# Patient Record
Sex: Male | Born: 1985 | Race: Asian | Hispanic: No | Marital: Single | Smoking: Former smoker
Health system: Southern US, Community
[De-identification: ages and names within clinical notes are randomized; demographics above are authoritative.]

---

## 2005-02-16 ENCOUNTER — Emergency Department (HOSPITAL_COMMUNITY): Admission: EM | Admit: 2005-02-16 | Discharge: 2005-02-16 | Payer: Self-pay | Admitting: Emergency Medicine

## 2008-11-23 ENCOUNTER — Emergency Department (HOSPITAL_COMMUNITY): Admission: EM | Admit: 2008-11-23 | Discharge: 2008-11-23 | Payer: Self-pay | Admitting: Emergency Medicine

## 2009-01-16 ENCOUNTER — Emergency Department (HOSPITAL_COMMUNITY): Admission: EM | Admit: 2009-01-16 | Discharge: 2009-01-16 | Payer: Self-pay | Admitting: Emergency Medicine

## 2009-02-23 ENCOUNTER — Emergency Department (HOSPITAL_COMMUNITY): Admission: EM | Admit: 2009-02-23 | Discharge: 2009-02-23 | Payer: Self-pay | Admitting: Emergency Medicine

## 2010-10-31 LAB — CBC
HCT: 48.4 % (ref 39.0–52.0)
Hemoglobin: 16.4 g/dL (ref 13.0–17.0)
MCV: 73.3 fL — ABNORMAL LOW (ref 78.0–100.0)
RBC: 6.6 MIL/uL — ABNORMAL HIGH (ref 4.22–5.81)
WBC: 7.5 10*3/uL (ref 4.0–10.5)

## 2010-10-31 LAB — COMPREHENSIVE METABOLIC PANEL
Alkaline Phosphatase: 91 U/L (ref 39–117)
BUN: 12 mg/dL (ref 6–23)
CO2: 27 mEq/L (ref 19–32)
Chloride: 102 mEq/L (ref 96–112)
Creatinine, Ser: 1.07 mg/dL (ref 0.4–1.5)
GFR calc non Af Amer: >60 mL/min (ref >60)
Glucose, Bld: 98 mg/dL (ref 70–99)
Potassium: 3.8 mEq/L (ref 3.5–5.1)
Total Bilirubin: 0.9 mg/dL (ref 0.3–1.2)

## 2010-10-31 LAB — DIFFERENTIAL
Basophils Absolute: 0 10*3/uL (ref 0.0–0.1)
Basophils Relative: 0 % (ref 0–1)
Lymphocytes Relative: 17 % (ref 12–46)
Neutro Abs: 4.8 10*3/uL (ref 1.7–7.7)

## 2010-10-31 LAB — LIPASE, BLOOD: Lipase: 19 U/L (ref 11–59)

## 2018-01-20 ENCOUNTER — Emergency Department (HOSPITAL_COMMUNITY): Payer: Self-pay

## 2018-01-20 ENCOUNTER — Encounter (HOSPITAL_COMMUNITY): Payer: Self-pay | Admitting: Nurse Practitioner

## 2018-01-20 ENCOUNTER — Emergency Department (HOSPITAL_COMMUNITY)
Admission: EM | Admit: 2018-01-20 | Discharge: 2018-01-20 | Disposition: A | Discharge status: Home / Self Care | Payer: Self-pay | Attending: Emergency Medicine | Admitting: Emergency Medicine

## 2018-01-20 DIAGNOSIS — K529 Noninfective gastroenteritis and colitis, unspecified: Secondary | ICD-10-CM

## 2018-01-20 DIAGNOSIS — R110 Nausea: Secondary | ICD-10-CM

## 2018-01-20 DIAGNOSIS — R1084 Generalized abdominal pain: Secondary | ICD-10-CM

## 2018-01-20 DIAGNOSIS — Z87891 Personal history of nicotine dependence: Secondary | ICD-10-CM | POA: Insufficient documentation

## 2018-01-20 DIAGNOSIS — R197 Diarrhea, unspecified: Secondary | ICD-10-CM

## 2018-01-20 LAB — LIPASE, BLOOD: Lipase: 26 U/L (ref 11–51)

## 2018-01-20 LAB — CBC
HEMATOCRIT: 48.2 % (ref 39.0–52.0)
Hemoglobin: 16.2 g/dL (ref 13.0–17.0)
MCH: 24.4 pg — ABNORMAL LOW (ref 26.0–34.0)
MCHC: 33.6 g/dL (ref 30.0–36.0)
MCV: 72.5 fL — AB (ref 78.0–100.0)
PLATELETS: 282 10*3/uL (ref 150–400)
RBC: 6.65 MIL/uL — ABNORMAL HIGH (ref 4.22–5.81)
RDW: 14.4 % (ref 11.5–15.5)
WBC: 7.4 10*3/uL (ref 4.0–10.5)

## 2018-01-20 LAB — COMPREHENSIVE METABOLIC PANEL
ALK PHOS: 46 U/L (ref 38–126)
ALT: 43 U/L (ref 0–44)
AST: 33 U/L (ref 15–41)
Albumin: 4.2 g/dL (ref 3.5–5.0)
Anion gap: 7 (ref 5–15)
BILIRUBIN TOTAL: 2.1 mg/dL — AB (ref 0.3–1.2)
BUN: 17 mg/dL (ref 6–20)
CO2: 30 mmol/L (ref 22–32)
Calcium: 9.4 mg/dL (ref 8.9–10.3)
Chloride: 102 mmol/L (ref 98–111)
Creatinine, Ser: 1.04 mg/dL (ref 0.61–1.24)
GFR calc Af Amer: >60 mL/min (ref >60)
GLUCOSE: 91 mg/dL (ref 70–99)
POTASSIUM: 4.4 mmol/L (ref 3.5–5.1)
Sodium: 139 mmol/L (ref 135–145)
TOTAL PROTEIN: 7.6 g/dL (ref 6.5–8.1)

## 2018-01-20 LAB — URINALYSIS, ROUTINE W REFLEX MICROSCOPIC
BILIRUBIN URINE: NEGATIVE
GLUCOSE, UA: NEGATIVE mg/dL
Hgb urine dipstick: NEGATIVE
KETONES UR: 5 mg/dL — AB
LEUKOCYTES UA: NEGATIVE
Nitrite: NEGATIVE
PH: 5 (ref 5.0–8.0)
Protein, ur: NEGATIVE mg/dL
Specific Gravity, Urine: 1.025 (ref 1.005–1.030)

## 2018-01-20 MED ORDER — DICYCLOMINE HCL 20 MG PO TABS: 20.0000 mg | ORAL_TABLET | Freq: Three times a day (TID) | ORAL | 0 refills | Status: DC | PRN

## 2018-01-20 MED ORDER — FAMOTIDINE IN NACL 20-0.9 MG/50ML-% IV SOLN
20.0000 mg | Freq: Once | INTRAVENOUS | Status: AC
  Administered 2018-01-20: 20 mg via INTRAVENOUS
  Filled 2018-01-20: qty 50

## 2018-01-20 MED ORDER — IOPAMIDOL (ISOVUE-300) INJECTION 61%
100.0000 mL | Freq: Once | INTRAVENOUS | Status: AC | PRN
  Administered 2018-01-20: 100 mL via INTRAVENOUS

## 2018-01-20 MED ORDER — GI COCKTAIL ~~LOC~~
30.0000 mL | Freq: Once | ORAL | Status: AC
  Administered 2018-01-20: 30 mL via ORAL
  Filled 2018-01-20: qty 30

## 2018-01-20 MED ORDER — IOPAMIDOL (ISOVUE-300) INJECTION 61%
INTRAVENOUS | Status: AC
  Administered 2018-01-20: 20:00:00
  Filled 2018-01-20: qty 100

## 2018-01-20 MED ORDER — SODIUM CHLORIDE 0.9 % IV BOLUS
1000.0000 mL | Freq: Once | INTRAVENOUS | Status: AC
  Administered 2018-01-20: 1000 mL via INTRAVENOUS

## 2018-01-20 MED ORDER — DICYCLOMINE HCL 10 MG PO CAPS
20.0000 mg | ORAL_CAPSULE | Freq: Once | ORAL | Status: AC
  Administered 2018-01-20: 20 mg via ORAL
  Filled 2018-01-20: qty 2

## 2018-01-20 MED ORDER — ONDANSETRON 4 MG PO TBDP: 4.0000 mg | ORAL_TABLET | Freq: Three times a day (TID) | ORAL | 0 refills | Status: DC | PRN

## 2018-01-20 MED ORDER — ONDANSETRON HCL 4 MG/2ML IJ SOLN
4.0000 mg | Freq: Once | INTRAMUSCULAR | Status: AC
  Administered 2018-01-20: 4 mg via INTRAVENOUS
  Filled 2018-01-20: qty 2

## 2018-01-20 NOTE — ED Notes (Signed)
Pt aware urine sample is needed 

## 2018-01-20 NOTE — ED Provider Notes (Signed)
[facility]-EMERGENCY DEPT Provider Note   CSN: [id number] Arrival date & time: 01/20/18  1354     History   Chief Complaint Chief Complaint  Patient presents with  . Abdominal Pain    HPI Christian Mcintosh is a 32 y.o. male with no known/reported PMHx, who presents to the ED with complaints of generalized abdominal pain x 1 week as well as bloating, early satiety, nausea, and diarrhea.  He states that last Saturday he had "stomachache" and after that his symptoms started.  He describes his pain as 6/10 intermittent generalized burning abdominal pain that is nonradiating, worse with eating, and somewhat improved with Pepto-Bismol and Colace.  He reports 3 episodes daily of nonbloody loose/liquidy diarrhea.  He states that anytime he eats he feels very full very quickly, and feels bloated.  He admits to drinking alcohol regularly, has about 4 drinks every 3 days, had alcohol on Wednesday and Friday (4 and 2 days ago respectively).  He denies having a PCP or GI specialist that he sees.  He denies having any medical problems.  He states that just prior to onset of symptoms he had cereal with milk, and he is not used to eating that however denies any other suspicious food intake.  He denies fevers, chills, CP, SOB, vomiting, constipation, obstipation, melena, hematochezia, hematuria, dysuria, myalgias, arthralgias, numbness, tingling, focal weakness, or any other complaints at this time. Denies recent travel, sick contacts, suspicious food intake, NSAID use, recent abx, or prior abd surgeries.   The history is provided by the patient and medical records. No language interpreter was used.    History reviewed. No pertinent past medical history.  There are no active problems to display for this patient.   History reviewed. No pertinent surgical history.      Home Medications    Prior to Admission medications   Not on File    Family History No family history on  file.  Social History Social History   Tobacco Use  . Smoking status: Former Smoker  Substance Use Topics  . Alcohol use: Yes  . Drug use: Not Currently    Types: Marijuana     Allergies   Patient has no known allergies.   Review of Systems Review of Systems  Constitutional: Negative for chills and fever.  Respiratory: Negative for shortness of breath.   Cardiovascular: Negative for chest pain.  Gastrointestinal: Positive for abdominal distention (bloating/early satiety), abdominal pain, diarrhea and nausea. Negative for blood in stool, constipation and vomiting.  Genitourinary: Negative for dysuria and hematuria.  Musculoskeletal: Negative for arthralgias and myalgias.  Skin: Negative for color change.  Allergic/Immunologic: Negative for immunocompromised state.  Neurological: Negative for weakness and numbness.  Psychiatric/Behavioral: Negative for confusion.   All other systems reviewed and are negative for acute change except as noted in the HPI.    Physical Exam Updated Vital Signs BP 136/88 (BP Location: Right Arm)   Pulse 60   Temp 98 F (36.7 C) (Oral)   Resp 16   Ht 5\' 4"  (1.626 m)   Wt 81.2 kg (179 lb)   SpO2 100%   BMI 30.73 kg/m   Physical Exam  Constitutional: He is oriented to person, place, and time. Vital signs are normal. He appears well-developed and well-nourished.  Non-toxic appearance. No distress.  Afebrile, nontoxic, NAD  HENT:  Head: Normocephalic and atraumatic.  Mouth/Throat: Oropharynx is clear and moist and mucous membranes are normal.  Eyes: Conjunctivae and EOM are normal. Right  eye exhibits no discharge. Left eye exhibits no discharge.  Neck: Normal range of motion. Neck supple.  Cardiovascular: Normal rate, regular rhythm, normal heart sounds and intact distal pulses. Exam reveals no gallop and no friction rub.  No murmur heard. Pulmonary/Chest: Effort normal and breath sounds normal. No respiratory distress. He has no decreased  breath sounds. He has no wheezes. He has no rhonchi. He has no rales.  Abdominal: Soft. Normal appearance and bowel sounds are normal. He exhibits no distension. There is tenderness in the right lower quadrant, suprapubic area and left lower quadrant. There is no rigidity, no rebound, no guarding, no CVA tenderness, no tenderness at McBurney's point and negative Murphy's sign.  Soft, nondistended, +BS throughout, with mild diffuse lower abd TTP, no r/g/r, neg murphy's, neg mcburney's, no CVA TTP   Musculoskeletal: Normal range of motion.  Neurological: He is alert and oriented to person, place, and time. He has normal strength. No sensory deficit.  Skin: Skin is warm, dry and intact. No rash noted.  Psychiatric: He has a normal mood and affect.  Nursing note and vitals reviewed.    ED Treatments / Results  Labs (all labs ordered are listed, but only abnormal results are displayed) Labs Reviewed  COMPREHENSIVE METABOLIC PANEL - Abnormal; Notable for the following components:      Result Value   Total Bilirubin 2.1 (*)    All other components within normal limits  CBC - Abnormal; Notable for the following components:   RBC 6.65 (*)    MCV 72.5 (*)    MCH 24.4 (*)    All other components within normal limits  URINALYSIS, ROUTINE W REFLEX MICROSCOPIC - Abnormal; Notable for the following components:   Ketones, ur 5 (*)    All other components within normal limits  LIPASE, BLOOD    EKG None  Radiology Ct Abdomen Pelvis W Contrast  Result Date: 01/20/2018 CLINICAL DATA:  Diffuse abdominal pain with bloating, diarrhea EXAM: CT ABDOMEN AND PELVIS WITH CONTRAST TECHNIQUE: Multidetector CT imaging of the abdomen and pelvis was performed using the standard protocol following bolus administration of intravenous contrast. CONTRAST:  ISOVUE-300 IOPAMIDOL (ISOVUE-300) INJECTION 61% COMPARISON:  None. FINDINGS: Lower chest: No acute abnormality. Hepatobiliary: No focal liver abnormality is  seen. No gallstones, gallbladder wall thickening, or biliary dilatation. Pancreas: Unremarkable. No pancreatic ductal dilatation or surrounding inflammatory changes. Spleen: Normal in size without focal abnormality. Adrenals/Urinary Tract: Adrenal glands are unremarkable. Kidneys are normal, without renal calculi, focal lesion, or hydronephrosis. Bladder is unremarkable. Stomach/Bowel: Appendix within normal limits. Stomach is nonenlarged. No dilated small bowel. Minimal wall thickening involving the transverse colon. 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 Vascular/Lymphatic: No significant vascular findings are present. No enlarged abdominal or pelvic lymph nodes. Reproductive: Prostate is unremarkable. Other: Negative for free air or free fluid. Musculoskeletal: No acute or significant osseous findings. IMPRESSION: 1. Minimal wall thickening of the transverse colon, may reflect a mild colitis of infectious or inflammatory etiology 2. Descending and sigmoid colon diverticular disease without convincing evidence for inflammatory change. Electronically Signed   By: Jasmine Pang M.D.   On: 01/20/2018 20:14    Procedures Procedures (including critical care time)  Medications Ordered in ED Medications  sodium chloride 0.9 % bolus 1,000 mL (0 mLs Intravenous Stopped 01/20/18 1918)  ondansetron (ZOFRAN) injection 4 mg (4 mg Intravenous Given 01/20/18 1830)  gi cocktail (Maalox,Lidocaine,Donnatal) (30 mLs Oral Given 01/20/18 1815)  famotidine (PEPCID) IVPB 20 mg premix (0 mg  Intravenous Stopped 01/20/18 1900)  dicyclomine (BENTYL) capsule 20 mg (20 mg Oral Given 01/20/18 1815)  iopamidol (ISOVUE-300) 61 % injection 100 mL (100 mLs Intravenous Contrast Given 01/20/18 1929)  iopamidol (ISOVUE-300) 61 % injection (  Contrast Given 01/20/18 2003)     Initial Impression / Assessment and Plan / ED Course  I have reviewed the triage vital signs and the nursing notes.  Pertinent labs & imaging  results that were available during my care of the patient were reviewed by me and considered in my medical decision making (see chart for details).     32 y.o. male here with generalized abd pain x1 wk with bloating/early satiety and nausea and diarrhea. On exam, diffuse lower abd TTP, nonperitoneal. No distension noted. Work up thus far reveals: lipase WNL; CMP with elevated Tbili 2.1 but otherwise WNL; CBC WNL. Awaiting U/A. Will get CT abd/pelv to r/o diverticulitis vs colitis vs other etiology. Will give meds/fluids and reassess shortly.   9:57 PM U/A unremarkable. CT abd/pelv with minimal wall thickening of transverse colon which may reflect mild colitis of infections or inflammatory etiology; descending and sigmoid colon diverticular disease without diverticulitis. Given lack of concerning features that would make me suspect bacterial etiology and lack of leukocytosis/fever/etc, I suspect this is likely a viral colitis. Doubt need for abx. Pt feeling better and tolerating PO well. Will send home with bentyl and zofran, advised tylenol/motrin for pain, BRAT diet, other OTC remedies for symptomatic relief, and f/up with GI in 1wk for recheck symptoms and recheck LFTs. I explained the diagnosis and have given explicit precautions to return to the ER including for any other new or worsening symptoms. The patient understands and accepts the medical plan as it's been dictated and I have answered their questions. Discharge instructions concerning home care and prescriptions have been given. The patient is STABLE and is discharged to home in good condition.    Final Clinical Impressions(s) / ED Diagnoses   Final diagnoses:  Generalized abdominal pain  Nausea  Diarrhea, unspecified type  Colitis  Hyperbilirubinemia    ED Discharge Orders        Ordered    ondansetron (ZOFRAN ODT) 4 MG disintegrating tablet  Every 8 hours PRN     01/20/18 2157    dicyclomine (BENTYL) 20 MG tablet  3 times daily  with meals PRN     01/20/18 [address], [place], [place] 01/20/18 2158    Loren Racer, MD 01/22/18 1654

## 2018-01-20 NOTE — Discharge Instructions (Addendum)
Your CT scan shows that you have mild colitis, which is an infection/inflammation in the colon; this is likely from a virus and usually will pass on its own. Use zofran as prescribed, as needed for nausea. Alternate between tylenol and motrin as needed for pain. Take bentyl to help with abdominal pain and diarrhea. May consider using over the counter tums, maalox, pepto bismol, or other over the counter remedies to help with symptoms. Stay well hydrated with small sips of fluids throughout the day. Follow a BRAT (banana-rice-applesauce-toast) diet as described below for the next 24-48 hours. The 'BRAT' diet is suggested, then progress to diet as tolerated as symptoms abate. Call your regular doctor if bloody stools, persistent diarrhea, vomiting, fever or abdominal pain. Follow up with the gastroenterologist in 1 week for recheck of symptoms and ongoing management of this issue, as well as to recheck your labs. Return to ER for changing or worsening of symptoms.

## 2018-01-20 NOTE — ED Notes (Signed)
Bed: WA08 Expected date:  Expected time:  Means of arrival:  Comments: 

## 2018-01-20 NOTE — ED Triage Notes (Signed)
Pt is c/o diffuse abdominal pain that he states is associated with severe burning bloating. Adds that it all started 7 days ago when he experienced a bout of diarrhea. States he hasn't recover ever since.

## 2019-05-30 ENCOUNTER — Ambulatory Visit (HOSPITAL_COMMUNITY)
Admission: EM | Admit: 2019-05-30 | Discharge: 2019-05-30 | Disposition: A | Discharge status: Home / Self Care | Payer: Self-pay | Attending: Family Medicine | Admitting: Family Medicine

## 2019-05-30 ENCOUNTER — Other Ambulatory Visit: Payer: Self-pay

## 2019-05-30 ENCOUNTER — Encounter (HOSPITAL_COMMUNITY): Payer: Self-pay

## 2019-05-30 DIAGNOSIS — S39011A Strain of muscle, fascia and tendon of abdomen, initial encounter: Secondary | ICD-10-CM

## 2019-05-30 MED ORDER — DICLOFENAC SODIUM 75 MG PO TBEC: 75.0000 mg | DELAYED_RELEASE_TABLET | Freq: Two times a day (BID) | ORAL | 0 refills | Status: AC

## 2019-05-30 NOTE — Discharge Instructions (Addendum)
If not better in a week, return for further evaluation

## 2019-05-30 NOTE — ED Provider Notes (Signed)
[facility]    CSN: [id number] Arrival date & time: 05/30/19  0940      History   Chief Complaint Chief Complaint  Patient presents with  . Appointment  . Groin Pain    HPI Christian Mcintosh is a 33 y.o. male.   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  He does work out as well.  He thinks there may have been swelling in each of his testes during week, but it resolves when home and only aches on left side some of the time.  No known trauma  No fever, discharge or persistent swelling.       History reviewed. No pertinent past medical history.  There are no active problems to display for this patient.   History reviewed. No pertinent surgical history.     Home Medications    Prior to Admission medications   Medication Sig Start Date End Date Taking? Authorizing Provider  diclofenac (VOLTAREN) 75 MG EC tablet Take 1 tablet (75 mg total) by mouth 2 (two) times daily. 05/30/19   Elvina Sidle, MD  Docusate Sodium (STOOL SOFTENER) 100 MG capsule Take 100 mg by mouth 2 (two) times daily as needed for constipation.    [provider]  dicyclomine (BENTYL) 20 MG tablet Take 1 tablet (20 mg total) by mouth 3 (three) times daily with meals as needed for spasms (abdominal pain and diarrhea). 01/20/18 05/30/19  Street, [place], PA-C    Family History Family History  Problem Relation Age of Onset  . Cancer Mother   . Diabetes Father     Social History Social History   Tobacco Use  . 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 Smokeless tobacco: Never Used  Substance Use Topics  . Alcohol use: Yes    Comment: every 3-4 days  . Drug use: Not Currently    Types: Marijuana     Allergies   Patient has no known allergies.   Review of Systems Review of Systems   Physical Exam Triage Vital Signs ED Triage Vitals  Enc Vitals Group     BP      Pulse      Resp      Temp       Temp src      SpO2      Weight      Height      Head Circumference      Peak Flow      Pain Score      Pain Loc      Pain Edu?      Excl. in GC?    No data found.  Updated Vital Signs BP 128/74 (BP Location: Right Arm)   Pulse 93   Temp 98.6 F (37 C) (Oral)   Resp 16   SpO2 100%    Physical Exam   UC Treatments / Results  Labs (all labs ordered are listed, but only abnormal results are displayed) Labs Reviewed - No data to display  EKG   Radiology No results found.  Procedures Procedures (including critical care time)  Medications Ordered in UC Medications - No data to display  Initial Impression / Assessment and Plan / UC Course  I have reviewed the triage vital signs and the nursing notes.  Pertinent labs & imaging results that were available during my care of the patient were reviewed by me and considered in my medical  decision making (see chart for details).    Final Clinical Impressions(s) / UC Diagnoses   Final diagnoses:  Strain of abdominal wall, initial encounter     Discharge Instructions     If not better in a week, return for further evaluation    ED Prescriptions    Medication Sig Dispense Auth. Provider   diclofenac (VOLTAREN) 75 MG EC tablet Take 1 tablet (75 mg total) by mouth 2 (two) times daily. 14 tablet Robyn Haber, MD     I have reviewed the PDMP during this encounter.   Robyn Haber, MD 05/30/19 1041

## 2019-05-30 NOTE — ED Triage Notes (Signed)
Patient presents to Urgent Care with complaints of left groin pain that radiates down to his left foot with certain movements since about a week ago. Patient reports he thinks he pulled a muscle in the gym.

## 2019-06-08 IMAGING — CT CT ABD-PELV W/ CM
2 of 4 series · 17 of 46 positions shown, 19 images · IV contrast (ISOVUE)
Comparison: None.

CLINICAL DATA: Diffuse abdominal pain with bloating, diarrhea

EXAM:
CT ABDOMEN AND PELVIS WITH CONTRAST
TECHNIQUE: Multidetector CT imaging of the abdomen and pelvis was performed
using the standard protocol following bolus administration of
intravenous contrast.
CONTRAST:  100mL 4A1HBR-4DD IOPAMIDOL (4A1HBR-4DD) INJECTION 61%

[Series 2: axial st · axial · 0.85mm/px · z∈[+1042,+1427]mm · 14 of 87 slices shown, 16 images]
[im 5/87  soft-tissue]
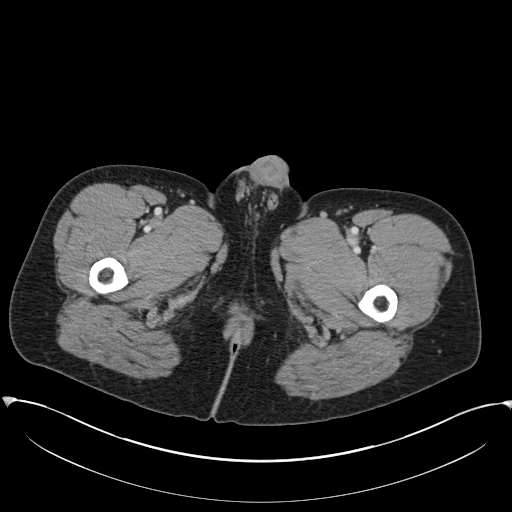
[im 5/87  bone]
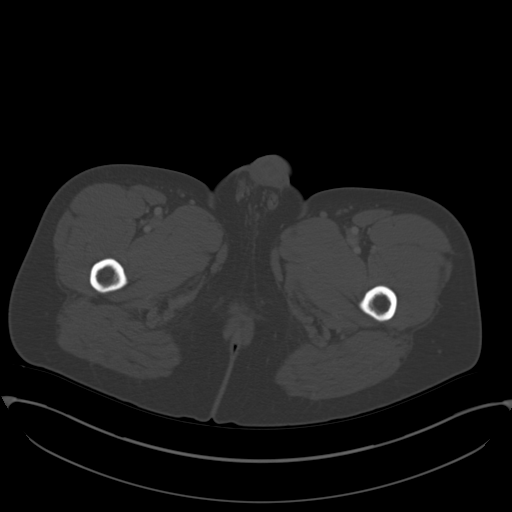
[im 10/87  soft-tissue]
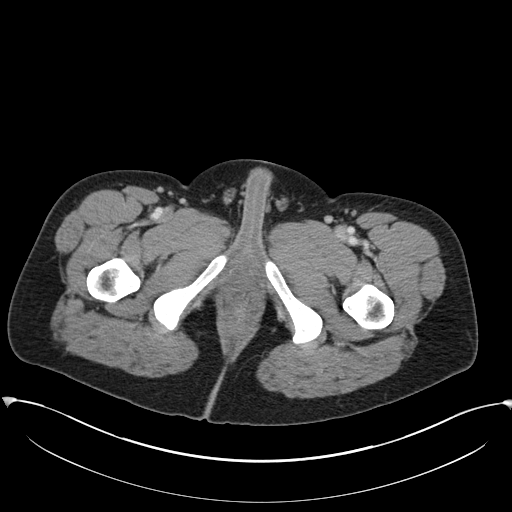
[im 19/87  soft-tissue]
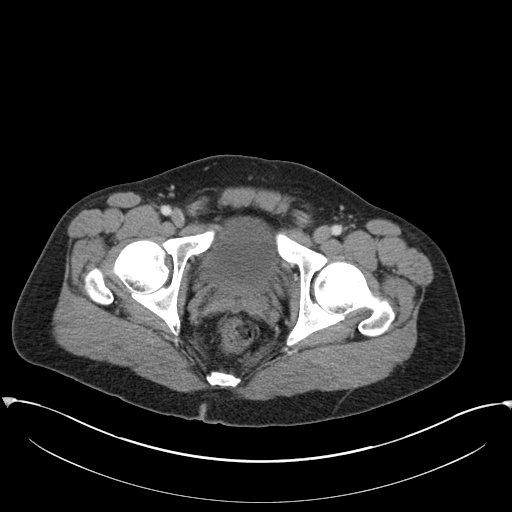
[im 23/87  soft-tissue]
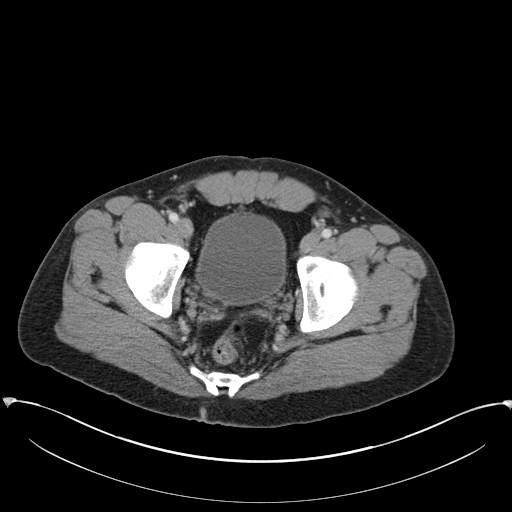
[im 28/87  soft-tissue]
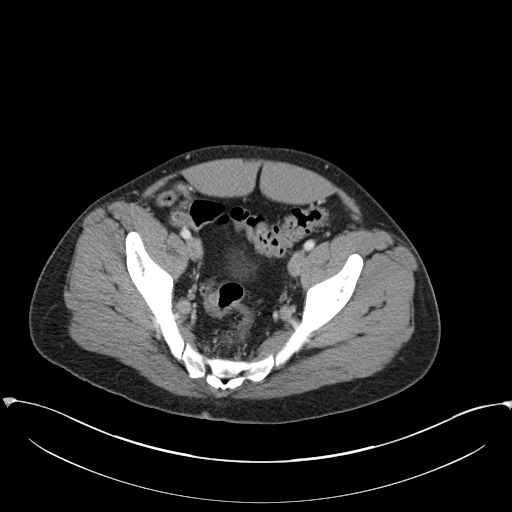
[im 37/87  soft-tissue]
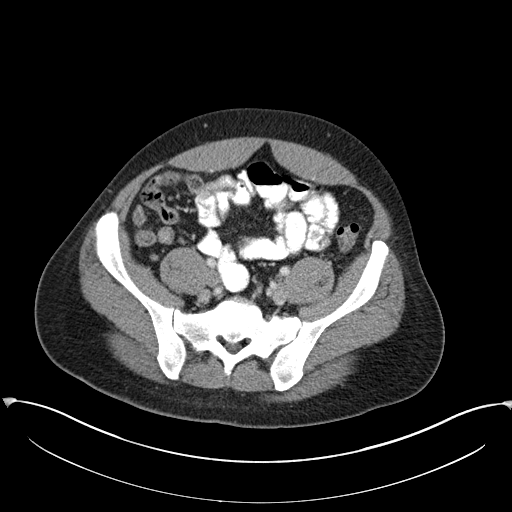
[im 41/87  soft-tissue]
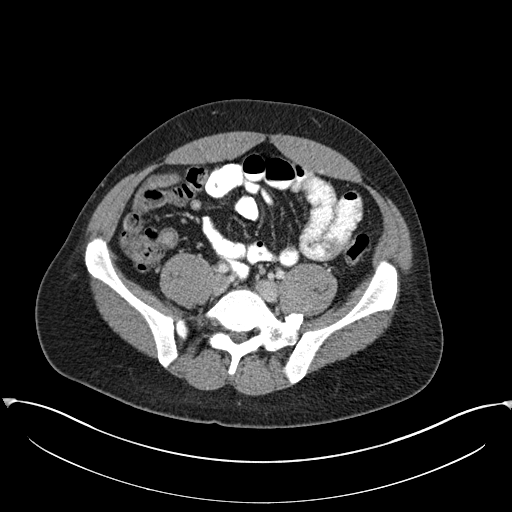
[im 46/87  soft-tissue]
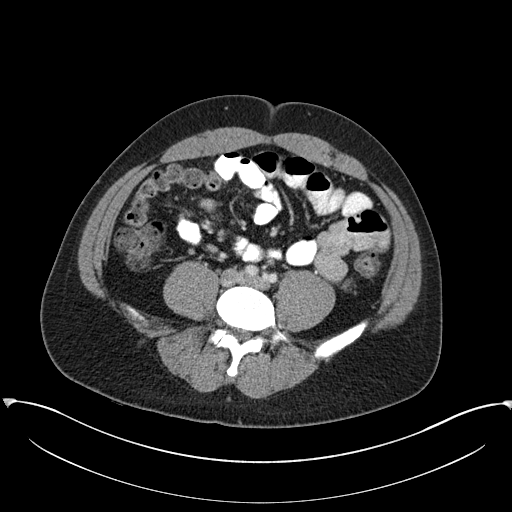
[im 50/87  soft-tissue]
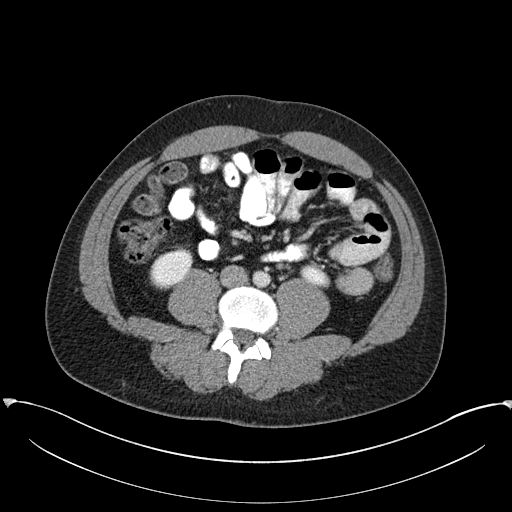
[im 50/87  bone]
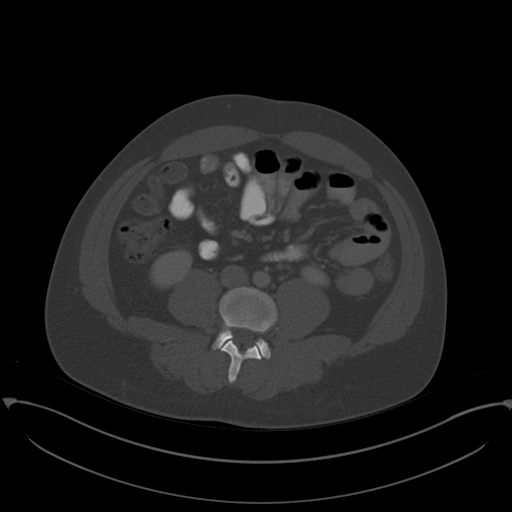
[im 59/87  soft-tissue]
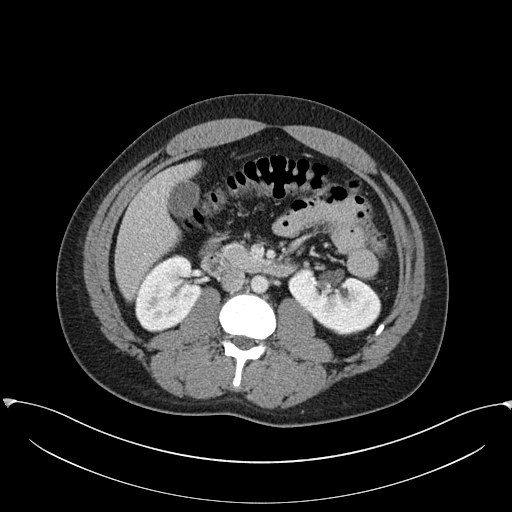
[im 64/87  soft-tissue]
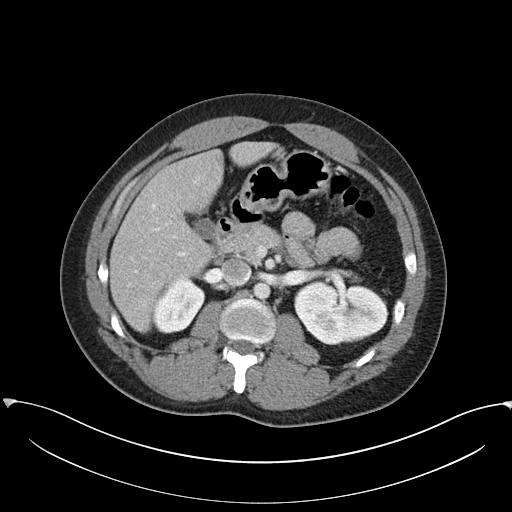
[im 68/87  soft-tissue]
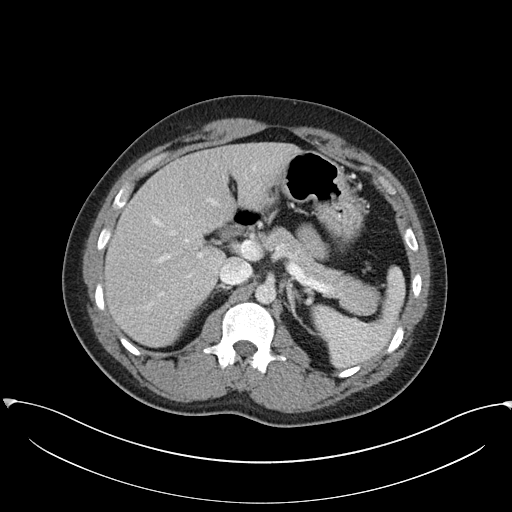
[im 77/87  soft-tissue]
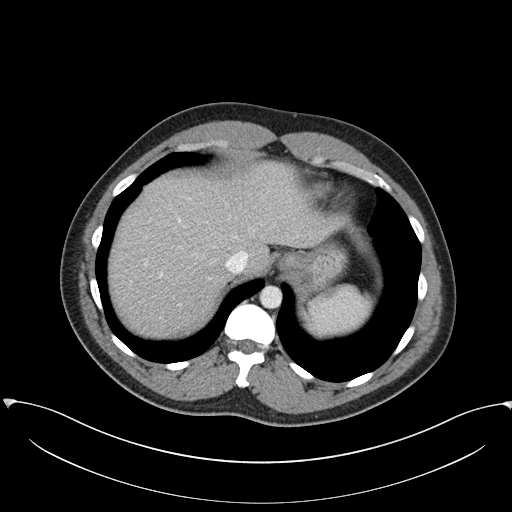
[im 82/87  soft-tissue]
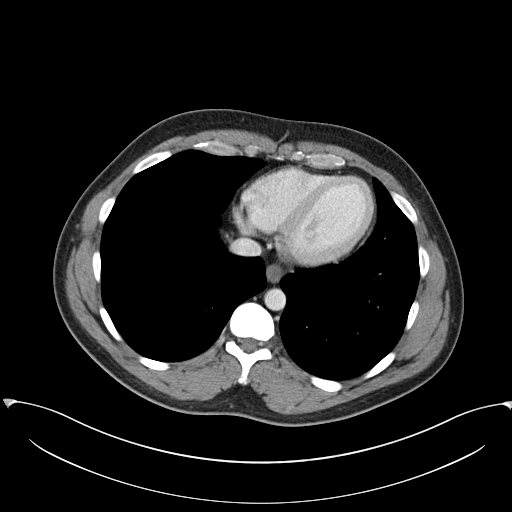

[Series 4: coronal st · coronal · 0.82mm/px · 3 of 101 slices shown]
[im 34/101  soft-tissue]
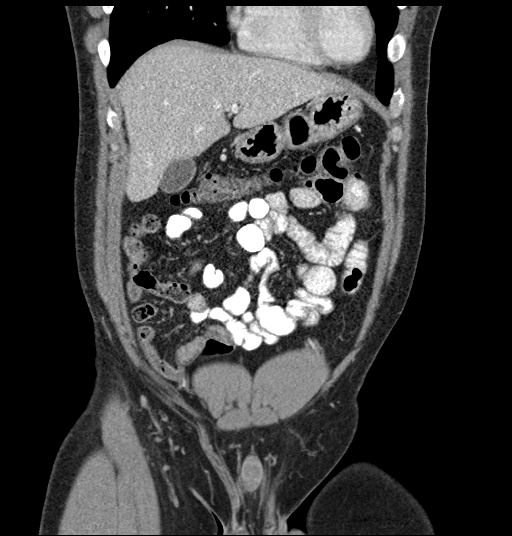
[im 45/101  soft-tissue]
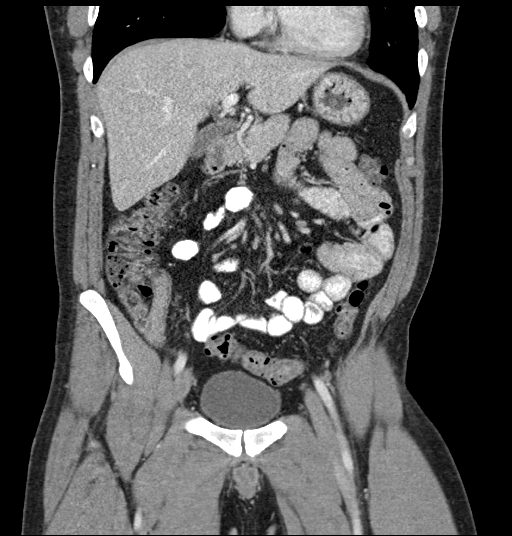
[im 56/101  soft-tissue]
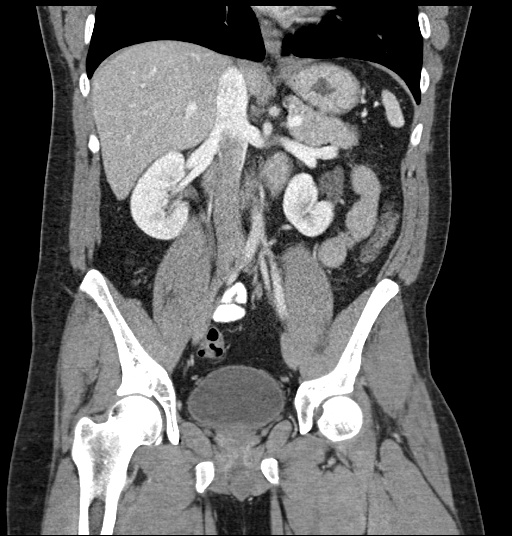

[17 of 46 positions shown; findings below may reference images not displayed]

FINDINGS: Lower chest: No acute abnormality.

Hepatobiliary: No focal liver abnormality is seen. No gallstones,
gallbladder wall thickening, or biliary dilatation.

Pancreas: Unremarkable. No pancreatic ductal dilatation or
surrounding inflammatory changes.

Spleen: Normal in size without focal abnormality.

Adrenals/Urinary Tract: Adrenal glands are unremarkable. Kidneys are
normal, without renal calculi, focal lesion, or hydronephrosis.
Bladder is unremarkable.

Stomach/Bowel: Appendix within normal limits. Stomach is
nonenlarged. No dilated small bowel. Minimal wall thickening
involving the transverse colon. Diverticular disease of the
descending and sigmoid colon.

Vascular/Lymphatic: No significant vascular findings are present. No
enlarged abdominal or pelvic lymph nodes.

Reproductive: Prostate is unremarkable.

Other: Negative for free air or free fluid.

Musculoskeletal: No acute or significant osseous findings.
IMPRESSION: 1. Minimal wall thickening of the transverse colon, may reflect a
mild colitis of infectious or inflammatory etiology
2. Descending and sigmoid colon diverticular disease without
convincing evidence for inflammatory change.

## 2019-07-31 ENCOUNTER — Ambulatory Visit: Payer: Self-pay | Attending: Internal Medicine

## 2019-07-31 DIAGNOSIS — Z20822 Contact with and (suspected) exposure to covid-19: Secondary | ICD-10-CM | POA: Insufficient documentation

## 2019-08-02 LAB — NOVEL CORONAVIRUS, NAA: SARS-CoV-2, NAA: NOT DETECTED

## 2022-12-21 ENCOUNTER — Encounter: Payer: BLUE CROSS/BLUE SHIELD | Admitting: Urology

## 2022-12-21 NOTE — Progress Notes (Deleted)
   Assessment: No diagnosis found.   Plan: ***  Chief Complaint: No chief complaint on file.   History of Present Illness:  Christian Mcintosh is a 37 y.o. male who is seen in consultation from Patient, No Pcp Per for evaluation of ***.   Past Medical History:  No past medical history on file.  Past Surgical History:  No past surgical history on file.  Allergies:  No Known Allergies  Family History:  Family History  Problem Relation Age of Onset   Cancer Mother    Diabetes Father     Social History:  Social History   Tobacco Use   Smoking status: Former   Smokeless tobacco: Never  Building services engineer Use: Never used  Substance Use Topics   Alcohol use: Yes    Comment: every 3-4 days   Drug use: Not Currently    Types: Marijuana    Review of symptoms:  Constitutional:  Negative for unexplained weight loss, night sweats, fever, chills ENT:  Negative for nose bleeds, sinus pain, painful swallowing CV:  Negative for chest pain, shortness of breath, exercise intolerance, palpitations, loss of consciousness Resp:  Negative for cough, wheezing, shortness of breath GI:  Negative for nausea, vomiting, diarrhea, bloody stools GU:  Positives noted in HPI; otherwise negative for gross hematuria, dysuria, urinary incontinence Neuro:  Negative for seizures, poor balance, limb weakness, slurred speech Psych:  Negative for lack of energy, depression, anxiety Endocrine:  Negative for polydipsia, polyuria, symptoms of hypoglycemia (dizziness, hunger, sweating) Hematologic:  Negative for anemia, purpura, petechia, prolonged or excessive bleeding, use of anticoagulants  Allergic:  Negative for difficulty breathing or choking as a result of exposure to anything; no shellfish allergy; no allergic response (rash/itch) to materials, foods  Physical exam: There were no vitals taken for this visit. GENERAL APPEARANCE:  Well appearing, well developed, well nourished, NAD HEENT:  Atraumatic, Normocephalic, oropharynx clear. NECK: Supple without lymphadenopathy or thyromegaly. LUNGS: Clear to auscultation bilaterally. HEART: Regular Rate and Rhythm without murmurs, gallops, or rubs. ABDOMEN: Soft, non-tender, No Masses. EXTREMITIES: Moves all extremities well.  Without clubbing, cyanosis, or edema. NEUROLOGIC:  Alert and oriented x 3, normal gait, CN II-XII grossly intact.  MENTAL STATUS:  Appropriate. BACK:  Non-tender to palpation.  No CVAT SKIN:  Warm, dry and intact.    Results: No results found for this or any previous visit (from the past 24 hour(s)).
